# Patient Record
Sex: Male | Born: 1940 | Race: White | Hispanic: No | Marital: Married | Smoking: Current every day smoker
Health system: Southern US, Community
[De-identification: ages and names within clinical notes are randomized; demographics above are authoritative.]

## PROBLEM LIST (undated history)

## (undated) DIAGNOSIS — C801 Malignant (primary) neoplasm, unspecified: Secondary | ICD-10-CM

## (undated) DIAGNOSIS — E7800 Pure hypercholesterolemia, unspecified: Secondary | ICD-10-CM

## (undated) DIAGNOSIS — I10 Essential (primary) hypertension: Secondary | ICD-10-CM

## (undated) DIAGNOSIS — I82409 Acute embolism and thrombosis of unspecified deep veins of unspecified lower extremity: Secondary | ICD-10-CM

## (undated) HISTORY — PX: VARICOSE VEIN SURGERY: SHX832

---

## 2012-07-30 DEATH — deceased

## 2021-07-09 ENCOUNTER — Emergency Department (HOSPITAL_COMMUNITY): Payer: BC Managed Care – PPO

## 2021-07-09 ENCOUNTER — Encounter (HOSPITAL_COMMUNITY): Payer: Self-pay

## 2021-07-09 ENCOUNTER — Emergency Department (HOSPITAL_COMMUNITY)
Admission: EM | Admit: 2021-07-09 | Discharge: 2021-07-09 | Disposition: A | Discharge status: Home / Self Care | Payer: BC Managed Care – PPO | Attending: Emergency Medicine | Admitting: Emergency Medicine

## 2021-07-09 ENCOUNTER — Other Ambulatory Visit: Payer: Self-pay

## 2021-07-09 DIAGNOSIS — R4182 Altered mental status, unspecified: Secondary | ICD-10-CM | POA: Diagnosis present

## 2021-07-09 DIAGNOSIS — F17210 Nicotine dependence, cigarettes, uncomplicated: Secondary | ICD-10-CM | POA: Insufficient documentation

## 2021-07-09 DIAGNOSIS — I10 Essential (primary) hypertension: Secondary | ICD-10-CM | POA: Insufficient documentation

## 2021-07-09 DIAGNOSIS — Z8551 Personal history of malignant neoplasm of bladder: Secondary | ICD-10-CM | POA: Insufficient documentation

## 2021-07-09 DIAGNOSIS — G459 Transient cerebral ischemic attack, unspecified: Secondary | ICD-10-CM | POA: Diagnosis not present

## 2021-07-09 HISTORY — DX: Malignant (primary) neoplasm, unspecified: C80.1

## 2021-07-09 HISTORY — DX: Pure hypercholesterolemia, unspecified: E78.00

## 2021-07-09 HISTORY — DX: Essential (primary) hypertension: I10

## 2021-07-09 HISTORY — DX: Acute embolism and thrombosis of unspecified deep veins of unspecified lower extremity: I82.409

## 2021-07-09 LAB — CBC WITH DIFFERENTIAL/PLATELET
Abs Immature Granulocytes: 0.03 10*3/uL (ref 0.00–0.07)
Basophils Absolute: 0.1 10*3/uL (ref 0.0–0.1)
Basophils Relative: 1 %
Eosinophils Absolute: 0 10*3/uL (ref 0.0–0.5)
Eosinophils Relative: 0 %
HCT: 52.8 % — ABNORMAL HIGH (ref 39.0–52.0)
Hemoglobin: 17.5 g/dL — ABNORMAL HIGH (ref 13.0–17.0)
Immature Granulocytes: 0 %
Lymphocytes Relative: 13 %
Lymphs Abs: 1.1 10*3/uL (ref 0.7–4.0)
MCH: 33.7 pg (ref 26.0–34.0)
MCHC: 33.1 g/dL (ref 30.0–36.0)
MCV: 101.5 fL — ABNORMAL HIGH (ref 80.0–100.0)
Monocytes Absolute: 0.5 10*3/uL (ref 0.1–1.0)
Monocytes Relative: 5 %
Neutro Abs: 7.1 10*3/uL (ref 1.7–7.7)
Neutrophils Relative %: 81 %
Platelets: 168 10*3/uL (ref 150–400)
RBC: 5.2 MIL/uL (ref 4.22–5.81)
RDW: 13 % (ref 11.5–15.5)
WBC: 8.8 10*3/uL (ref 4.0–10.5)
nRBC: 0 % (ref 0.0–0.2)

## 2021-07-09 LAB — URINALYSIS, ROUTINE W REFLEX MICROSCOPIC
Bacteria, UA: NONE SEEN
Bilirubin Urine: NEGATIVE
Glucose, UA: NEGATIVE mg/dL
Ketones, ur: 5 mg/dL — AB
Leukocytes,Ua: NEGATIVE
Nitrite: NEGATIVE
Protein, ur: NEGATIVE mg/dL
Specific Gravity, Urine: 1.017 (ref 1.005–1.030)
pH: 6 (ref 5.0–8.0)

## 2021-07-09 LAB — COMPREHENSIVE METABOLIC PANEL
ALT: 20 U/L (ref 0–44)
AST: 22 U/L (ref 15–41)
Albumin: 4.1 g/dL (ref 3.5–5.0)
Alkaline Phosphatase: 59 U/L (ref 38–126)
Anion gap: 8 (ref 5–15)
BUN: 20 mg/dL (ref 8–23)
CO2: 27 mmol/L (ref 22–32)
Calcium: 9 mg/dL (ref 8.9–10.3)
Chloride: 103 mmol/L (ref 98–111)
Creatinine, Ser: 1.18 mg/dL (ref 0.61–1.24)
GFR, Estimated: >60 mL/min (ref >60)
Glucose, Bld: 108 mg/dL — ABNORMAL HIGH (ref 70–99)
Potassium: 3.8 mmol/L (ref 3.5–5.1)
Sodium: 138 mmol/L (ref 135–145)
Total Bilirubin: 1.1 mg/dL (ref 0.3–1.2)
Total Protein: 7.4 g/dL (ref 6.5–8.1)

## 2021-07-09 LAB — TSH: TSH: 1.058 u[IU]/mL (ref 0.350–4.500)

## 2021-07-09 LAB — ETHANOL: Alcohol, Ethyl (B): <10 mg/dL (ref <10)

## 2021-07-09 LAB — LIPASE, BLOOD: Lipase: 31 U/L (ref 11–51)

## 2021-07-09 NOTE — ED Triage Notes (Signed)
Pt presents to ED and states he woke up at 0200, states he was confused, not answering his wife's questions appropriately. States he is now answering questions appropriately and not confused. Pt states he went to bed at 2200 last night and felt fine.

## 2021-07-09 NOTE — Discharge Instructions (Addendum)
You were seen in the emerge department today with stroke symptoms.  Your MRI did not show a stroke but we discussed the benefit of keeping you in the hospital for some additional testing prior to sending you home.  You are electing to leave the hospital now but please know that you are welcome to return at any time should you change your mind about additional treatment.  I have placed a referral in our system for you to see a neurologist but realize that this will take some time.  If you develop any new or suddenly worsening symptoms please call 911.

## 2021-07-09 NOTE — ED Provider Notes (Signed)
Emergency Department Provider Note   I have reviewed the triage vital signs and the nursing notes.   HISTORY  Chief Complaint Altered Mental Status   HPI Lucas Taylor is a 80 y.o. male with past medical history reviewed below presents to the emergency department with mental status change overnight.  Patient went to bed and was last normal at 10 PM yesterday.  He woke up at around 2 AM to go to the bathroom.  Patient's wife states that she spoke to him but that he was not able to formulate a response.  He would say 1-2 word responses but seem to be having significant word finding difficulty.  Patient tells me that he could understand what his wife was saying but had difficulty formulating a response.  He ultimately laid back down and woke up again at 4 AM.  He went to make coffee but got confused and tried to pour the coffee into the half-and-half container.  His wife and patient's state that he is seeming much better at this point but they are concerned for the possibility of stroke.  He has no prior history of stroke.  He is on anticoagulation.  He is not having chest pain.  He has had some pressure behind the eyes several times over the past few days but no active headache.    Past Medical History:  Diagnosis Date   Cancer [facility])    Bladder   DVT (deep venous thrombosis) (HCC)    Hypercholesterolemia    Hypertension     There are no problems to display for this patient.   Past Surgical History:  Procedure Laterality Date   VARICOSE VEIN SURGERY      Allergies Patient has no known allergies.  No family history on file.  Social History Social History   Tobacco Use   Smoking status: Every Day    Packs/day: 0.50    Pack years: 0.00    Types: Cigarettes   Smokeless tobacco: Never  Substance Use Topics   Alcohol use: Yes    Comment: 1 glass of tonic a week   Drug use: Never    Review of Systems  Constitutional: No fever/chills Eyes: No visual changes. ENT: No  sore throat. Cardiovascular: Denies chest pain. Respiratory: Denies shortness of breath. Gastrointestinal: No abdominal pain.  No nausea, no vomiting.  No diarrhea.  No constipation. Genitourinary: Negative for dysuria. Musculoskeletal: Negative for back pain. Skin: Negative for rash. Neurological: Negative for headaches, focal weakness or numbness. Positive speech difficulty.   10-point ROS otherwise negative.  ____________________________________________   PHYSICAL EXAM:  VITAL SIGNS: ED Triage Vitals  Enc Vitals Group     BP 07/09/21 0850 (!) 167/106     Pulse Rate 07/09/21 0850 93     Resp 07/09/21 0850 18     Temp 07/09/21 0850 98.1 F (36.7 C)     Temp Source 07/09/21 0850 Oral     SpO2 07/09/21 0850 99 %     Weight 07/09/21 0843 195 lb (88.5 kg)     Height 07/09/21 0843 5\' 10"  (1.778 m)   Constitutional: Alert and oriented. Well appearing and in no acute distress. Eyes: Conjunctivae are normal. PERRL. EOMI. Head: Atraumatic. Nose: No congestion/rhinnorhea. Mouth/Throat: Mucous membranes are moist.   Neck: No stridor.  Cardiovascular: Normal rate, regular rhythm. Good peripheral circulation. Grossly normal heart sounds.   Respiratory: Normal respiratory effort.  No retractions. Lungs CTAB. Gastrointestinal: Soft and nontender. No distention.  Musculoskeletal: No lower extremity  tenderness nor edema. No gross deformities of extremities. Neurologic:  Normal speech and language. No gross focal neurologic deficits are appreciated. 5/5 strength in the bilateral upper/lower extremities. Normal sensation.  Skin:  Skin is warm, dry and intact. No rash noted.   ____________________________________________   LABS (all labs ordered are listed, but only abnormal results are displayed)  Labs Reviewed  COMPREHENSIVE METABOLIC PANEL - Abnormal; Notable for the following components:      Result Value   Glucose, Bld 108 (*)    All other components within normal limits  CBC  WITH DIFFERENTIAL/PLATELET - Abnormal; Notable for the following components:   Hemoglobin 17.5 (*)    HCT 52.8 (*)    MCV 101.5 (*)    All other components within normal limits  URINALYSIS, ROUTINE W REFLEX MICROSCOPIC - Abnormal; Notable for the following components:   Hgb urine dipstick MODERATE (*)    Ketones, ur 5 (*)    All other components within normal limits  ETHANOL  LIPASE, BLOOD  TSH   ____________________________________________  EKG   EKG Interpretation  Date/Time:  Monday July 09 2021 09:10:29 EDT Ventricular Rate:  99 PR Interval:    QRS Duration: 91 QT Interval:  366 QTC Calculation: 473 R Axis:   -1 Text Interpretation: Normal sinus rhythm Borderline repolarization abnormality Baseline wander in lead(s) II III aVF Confirmed by Nanda Quinton [phone]) on 07/09/2021 9:25:49 AM         ____________________________________________  RADIOLOGY  CT Head Wo Contrast  Result Date: 07/09/2021 CLINICAL DATA:  Mental status changes. EXAM: CT HEAD WITHOUT CONTRAST TECHNIQUE: Contiguous axial images were obtained from the base of the skull through the vertex without intravenous contrast. COMPARISON:  None. FINDINGS: Brain: There is no evidence for acute hemorrhage, hydrocephalus, mass lesion, or abnormal extra-axial fluid collection. No definite CT evidence for acute infarction. Diffuse loss of parenchymal volume is consistent with atrophy. Patchy low attenuation in the deep hemispheric and periventricular white matter is nonspecific, but likely reflects chronic microvascular ischemic demyelination. Vascular: No hyperdense vessel or unexpected calcification. Skull: No evidence for fracture. No worrisome lytic or sclerotic lesion. Sinuses/Orbits: The visualized paranasal sinuses and mastoid air cells are clear. Visualized portions of the globes and intraorbital fat are unremarkable. Other: None. IMPRESSION: 1. No acute intracranial abnormality. 2. Atrophy with chronic small  vessel white matter ischemic disease. Electronically Signed   By: Misty Stanley M.D.   On: 07/09/2021 09:46   MR BRAIN WO CONTRAST  Result Date: 07/09/2021 CLINICAL DATA:  Mental status change, unknown cause. Additional history provided: Transient confusion. EXAM: MRI HEAD WITHOUT CONTRAST TECHNIQUE: Multiplanar, multiecho pulse sequences of the brain and surrounding structures were obtained without intravenous contrast. COMPARISON:  Head CT 06/09/2021. FINDINGS: Brain: Mild-to-moderate generalized cerebral atrophy. 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 Comparatively mild cerebellar atrophy. Moderate multifocal T2/FLAIR hyperintensity within the cerebral white matter, nonspecific but compatible with chronic small vessel ischemic disease. There is no acute infarct. No evidence of an intracranial mass. No chronic intracranial blood products. No extra-axial fluid collection. No midline shift. Partially empty sella turcica. Vascular: Expected proximal arterial flow voids. Skull and upper cervical spine: No focal marrow lesion. Sinuses/Orbits: Visualized orbits show no acute finding. Bilateral lens replacements. Trace bilateral ethmoid sinus mucosal thickening. Other: Right mastoid effusion. IMPRESSION: No evidence of acute intracranial abnormality. 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 Moderate chronic small  vessel ischemic changes within the cerebral white matter. Mild-to-moderate generalized cerebral atrophy. Comparatively mild cerebellar atrophy. Right mastoid effusion. Electronically Signed   By: Kellie Simmering DO   On: 07/09/2021 13:50    ____________________________________________   PROCEDURES  Procedure(s) performed:   Procedures  None  ____________________________________________   INITIAL IMPRESSION / ASSESSMENT AND PLAN / ED COURSE  Pertinent labs &  imaging results that were available during my care of the patient were reviewed by me and considered in my medical decision making (see chart for details).   Patient presents to the emergency department with difficulty with speech at 2 AM.  Last normal at 10 PM.  He is not having any active deficits.  Differential includes TIA but the symptoms did happen at 2 AM when he awoke.  He is not having any active symptoms.  Recent changes to blood pressure medication noted and his pressures are running high here.  We will start with CT imaging of the head along with labs.  Patient may ultimately require MRI.   02:30 PM  Lab work, CT, MRI reviewed.  He has no evidence of acute infarct.  Discussed the case with Dr. Malen Gauze and given symptoms last night he does recommend admit for CTA head neck along with echo and other TIA work-up.  Discussed this with the patient.  His wife is at bedside during discussion.  Patient states that he does not want to come into the hospital.  We discussed the risk of this decision including increased risk for additional stroke within the next week especially.  We discussed that getting these tests done in the hospital and started on appropriate medication is essential.  Patient tells me he cannot come to the hospital and that he understands the risk.  His wife is at bedside and tried to convince him but is unable to do so.  Patient tells me he plans to return when he is ready.  ____________________________________________  FINAL CLINICAL IMPRESSION(S) / ED DIAGNOSES  Final diagnoses:  TIA (transient ischemic attack)     Note:  This document was prepared using Dragon voice recognition software and may include unintentional dictation errors.  Nanda Quinton, MD, [facility] Emergency Medicine    Elaiza Shoberg, [facility], MD 07/09/21 1438

## 2021-07-10 ENCOUNTER — Observation Stay (HOSPITAL_COMMUNITY)
Admission: EM | Admit: 2021-07-10 | Discharge: 2021-07-11 | Disposition: A | Discharge status: Home / Self Care | Payer: BC Managed Care – PPO | Attending: Family Medicine | Admitting: Family Medicine

## 2021-07-10 ENCOUNTER — Observation Stay (HOSPITAL_COMMUNITY): Payer: BC Managed Care – PPO

## 2021-07-10 ENCOUNTER — Other Ambulatory Visit: Payer: Self-pay

## 2021-07-10 ENCOUNTER — Encounter (HOSPITAL_COMMUNITY): Payer: Self-pay | Admitting: Emergency Medicine

## 2021-07-10 DIAGNOSIS — I480 Paroxysmal atrial fibrillation: Secondary | ICD-10-CM | POA: Diagnosis not present

## 2021-07-10 DIAGNOSIS — Z79899 Other long term (current) drug therapy: Secondary | ICD-10-CM | POA: Insufficient documentation

## 2021-07-10 DIAGNOSIS — R410 Disorientation, unspecified: Secondary | ICD-10-CM | POA: Diagnosis present

## 2021-07-10 DIAGNOSIS — K469 Unspecified abdominal hernia without obstruction or gangrene: Secondary | ICD-10-CM | POA: Diagnosis present

## 2021-07-10 DIAGNOSIS — F17210 Nicotine dependence, cigarettes, uncomplicated: Secondary | ICD-10-CM | POA: Diagnosis not present

## 2021-07-10 DIAGNOSIS — I82409 Acute embolism and thrombosis of unspecified deep veins of unspecified lower extremity: Secondary | ICD-10-CM | POA: Diagnosis present

## 2021-07-10 DIAGNOSIS — I10 Essential (primary) hypertension: Secondary | ICD-10-CM | POA: Diagnosis not present

## 2021-07-10 DIAGNOSIS — Z126 Encounter for screening for malignant neoplasm of bladder: Secondary | ICD-10-CM | POA: Diagnosis not present

## 2021-07-10 DIAGNOSIS — Z7901 Long term (current) use of anticoagulants: Secondary | ICD-10-CM | POA: Insufficient documentation

## 2021-07-10 DIAGNOSIS — G459 Transient cerebral ischemic attack, unspecified: Principal | ICD-10-CM | POA: Diagnosis present

## 2021-07-10 DIAGNOSIS — Z20822 Contact with and (suspected) exposure to covid-19: Secondary | ICD-10-CM | POA: Insufficient documentation

## 2021-07-10 DIAGNOSIS — Z720 Tobacco use: Secondary | ICD-10-CM | POA: Diagnosis present

## 2021-07-10 LAB — CBC WITH DIFFERENTIAL/PLATELET
Abs Immature Granulocytes: 0.02 10*3/uL (ref 0.00–0.07)
Basophils Absolute: 0.1 10*3/uL (ref 0.0–0.1)
Basophils Relative: 1 %
Eosinophils Absolute: 0.1 10*3/uL (ref 0.0–0.5)
Eosinophils Relative: 1 %
HCT: 53 % — ABNORMAL HIGH (ref 39.0–52.0)
Hemoglobin: 17.4 g/dL — ABNORMAL HIGH (ref 13.0–17.0)
Immature Granulocytes: 0 %
Lymphocytes Relative: 22 %
Lymphs Abs: 1.4 10*3/uL (ref 0.7–4.0)
MCH: 33.5 pg (ref 26.0–34.0)
MCHC: 32.8 g/dL (ref 30.0–36.0)
MCV: 101.9 fL — ABNORMAL HIGH (ref 80.0–100.0)
Monocytes Absolute: 0.5 10*3/uL (ref 0.1–1.0)
Monocytes Relative: 8 %
Neutro Abs: 4.3 10*3/uL (ref 1.7–7.7)
Neutrophils Relative %: 68 %
Platelets: 153 10*3/uL (ref 150–400)
RBC: 5.2 MIL/uL (ref 4.22–5.81)
RDW: 13.2 % (ref 11.5–15.5)
WBC: 6.3 10*3/uL (ref 4.0–10.5)
nRBC: 0 % (ref 0.0–0.2)

## 2021-07-10 LAB — BASIC METABOLIC PANEL
Anion gap: 8 (ref 5–15)
BUN: 22 mg/dL (ref 8–23)
CO2: 28 mmol/L (ref 22–32)
Calcium: 9 mg/dL (ref 8.9–10.3)
Chloride: 102 mmol/L (ref 98–111)
Creatinine, Ser: 1.24 mg/dL (ref 0.61–1.24)
GFR, Estimated: 60 mL/min — ABNORMAL LOW (ref >60)
Glucose, Bld: 93 mg/dL (ref 70–99)
Potassium: 3.8 mmol/L (ref 3.5–5.1)
Sodium: 138 mmol/L (ref 135–145)

## 2021-07-10 LAB — RESP PANEL BY RT-PCR (FLU A&B, COVID) ARPGX2
Influenza A by PCR: NEGATIVE
Influenza B by PCR: NEGATIVE
SARS Coronavirus 2 by RT PCR: NEGATIVE

## 2021-07-10 MED ORDER — ACETAMINOPHEN 325 MG PO TABS: 650.0000 mg | ORAL_TABLET | Freq: Four times a day (QID) | ORAL | Status: DC | PRN

## 2021-07-10 MED ORDER — ATORVASTATIN CALCIUM 40 MG PO TABS
40.0000 mg | ORAL_TABLET | Freq: Every day | ORAL | Status: DC
  Administered 2021-07-11: 40 mg via ORAL
  Filled 2021-07-10: qty 1

## 2021-07-10 MED ORDER — ACETAMINOPHEN 650 MG RE SUPP: 650.0000 mg | Freq: Four times a day (QID) | RECTAL | Status: DC | PRN

## 2021-07-10 MED ORDER — ONDANSETRON HCL 4 MG PO TABS: 4.0000 mg | ORAL_TABLET | Freq: Four times a day (QID) | ORAL | Status: DC | PRN

## 2021-07-10 MED ORDER — LOSARTAN POTASSIUM 50 MG PO TABS
50.0000 mg | ORAL_TABLET | Freq: Every day | ORAL | Status: DC
  Administered 2021-07-11: 50 mg via ORAL
  Filled 2021-07-10: qty 1

## 2021-07-10 MED ORDER — NICOTINE 21 MG/24HR TD PT24
21.0000 mg | MEDICATED_PATCH | Freq: Once | TRANSDERMAL | Status: AC
  Administered 2021-07-10: 21 mg via TRANSDERMAL
  Filled 2021-07-10: qty 1

## 2021-07-10 MED ORDER — ONDANSETRON HCL 4 MG/2ML IJ SOLN: 4.0000 mg | Freq: Four times a day (QID) | INTRAMUSCULAR | Status: DC | PRN

## 2021-07-10 MED ORDER — TRAZODONE HCL 50 MG PO TABS: 50.0000 mg | ORAL_TABLET | Freq: Every evening | ORAL | Status: DC | PRN

## 2021-07-10 MED ORDER — RIVAROXABAN 20 MG PO TABS
20.0000 mg | ORAL_TABLET | Freq: Every day | ORAL | Status: DC
  Administered 2021-07-11: 20 mg via ORAL
  Filled 2021-07-10: qty 1

## 2021-07-10 MED ORDER — METOPROLOL SUCCINATE ER 50 MG PO TB24
50.0000 mg | ORAL_TABLET | Freq: Every day | ORAL | Status: DC
  Administered 2021-07-11: 50 mg via ORAL
  Filled 2021-07-10: qty 1

## 2021-07-10 MED ORDER — POLYETHYLENE GLYCOL 3350 17 G PO PACK: 17.0000 g | PACK | Freq: Every day | ORAL | Status: DC | PRN

## 2021-07-10 MED ORDER — IOHEXOL 350 MG/ML SOLN
75.0000 mL | Freq: Once | INTRAVENOUS | Status: AC | PRN
  Administered 2021-07-10: 75 mL via INTRAVENOUS

## 2021-07-10 NOTE — ED Notes (Signed)
Pt has removed his leads, blood pressure cuff and put his clothes back on, even though he knows he being admitted.  Wife at bedside.

## 2021-07-10 NOTE — ED Provider Notes (Signed)
[facility] EMERGENCY DEPARTMENT Provider Note   CSN: [id number] Arrival date & time: 07/10/21  1051     History Chief Complaint  Patient presents with   Altered Mental Status    Lucas Taylor is a 80 y.o. male with a history including hypertension, hypercholesterolemia, history of DVT and bladder cancer presenting for reevaluation after being seen here yesterday and left AGAINST MEDICAL ADVICE due to symptoms strongly suggestive of TIA events.  He woke yesterday around 2 AM to go the bathroom at which time his wife noted that he was unable to speak to her.  He endorses that he was able to understand her but physically could not respond to her and has had significant word finding difficulties which have been intermittent until the time he presented here yesterday.  Wife also reports that he got very confused trying to make coffee yesterday morning which is very atypical for him.  He has no prior history of CVA.  He denies chest pain, headache.  He is anticoagulated with Xarelto.  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  During yesterday's visit he was strongly encouraged to stay for admission for a TIA work-up but at that time he refused.  He returns today for admission for this work-up.  He has had no further symptoms since he left here yesterday.  Of note, neurology consult was obtained yesterday via Dr. Malen Gauze who did recommend admission for CTA head and neck along with echocardiogram and other TIA work-up.  The history is provided by the patient.      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   Past Surgical History:  Procedure Laterality Date   VARICOSE VEIN SURGERY          History reviewed. No pertinent family history.  Social History   Tobacco Use   Smoking status: Every Day    Packs/day: 0.50    Pack years: 0.00    Types: Cigarettes   Smokeless tobacco: Never  Substance Use Topics   Alcohol use: Yes    Comment: 1 glass of tonic a week   Drug use: Never    Home Medications Prior to Admission medications   Medication Sig Start Date End Date Taking? Authorizing Provider  acetaminophen (TYLENOL) 500 MG tablet Take 1,000 mg by mouth every 6 (six) hours as needed for mild pain.    [provider]  losartan (COZAAR) 50 MG tablet Take 50 mg by mouth daily. 07/06/21   [provider]  metoprolol succinate (TOPROL-XL) 50 MG 24 hr tablet Take 50 mg by mouth daily. 07/06/21   [provider]  simvastatin (ZOCOR) 10 MG tablet Take 10 mg by mouth daily as needed. 06/27/21   [provider]  XARELTO 20 MG TABS tablet Take 20 mg by mouth daily. 06/22/21   [provider]    Allergies    Patient has no known allergies.  Review of Systems   Review of Systems  Constitutional:  Negative for chills and fever.  HENT:  Negative for congestion and sore throat.   Eyes: Negative.  Negative for visual disturbance.  Respiratory:  Negative for chest tightness and shortness of breath.  Cardiovascular:  Negative for chest pain.  Gastrointestinal:  Negative for abdominal pain, nausea and vomiting.  Genitourinary: Negative.   Musculoskeletal:  Negative for arthralgias, joint swelling and neck pain.  Skin: Negative.  Negative for rash and wound.  Neurological:  Positive for speech difficulty. Negative for dizziness, weakness, light-headedness, numbness and headaches.  Psychiatric/Behavioral:  Positive for confusion.   All other systems reviewed and are negative.  Physical Exam Updated Vital Signs BP (!) 142/109   Pulse 90   Temp 98.5 F (36.9 C) (Oral)   Resp (!) 22   Ht _0  (1.778 m)   Wt 88.5 kg   SpO2 91%    BMI 27.98 kg/m   Physical Exam Vitals and nursing note reviewed.  Constitutional:      Appearance: He is well-developed.  HENT:     Head: Normocephalic and atraumatic.  Eyes:     Conjunctiva/sclera: Conjunctivae normal.  Cardiovascular:     Rate and Rhythm: Normal rate and regular rhythm.     Heart sounds: Normal heart sounds.  Pulmonary:     Effort: Pulmonary effort is normal.     Breath sounds: Normal breath sounds. No wheezing.  Abdominal:     General: Bowel sounds are normal.     Palpations: Abdomen is soft.     Tenderness: There is no abdominal tenderness.  Musculoskeletal:        General: Normal range of motion.     Cervical back: Normal range of motion.  Skin:    General: Skin is warm and dry.  Neurological:     Mental Status: He is alert.     Cranial Nerves: Cranial nerves are intact. No dysarthria.     Motor: Motor function is intact.     Coordination: Coordination normal.    ED Results / Procedures / Treatments   Labs (all labs ordered are listed, but only abnormal results are displayed) Labs Reviewed  CBC WITH DIFFERENTIAL/PLATELET - Abnormal; Notable for the following components:      Result Value   Hemoglobin 17.4 (*)    HCT 53.0 (*)    MCV 101.9 (*)    All other components within normal limits  BASIC METABOLIC PANEL - Abnormal; Notable for the following components:   GFR, Estimated 60 (*)    All other components within normal limits  RESP PANEL BY RT-PCR (FLU A&B, COVID) [id number]    EKG None  Radiology CT Head Wo Contrast  Result Date: 07/09/2021 CLINICAL DATA:  Mental status changes. EXAM: CT HEAD WITHOUT CONTRAST TECHNIQUE: Contiguous axial images were obtained from the base of the skull through the vertex without intravenous contrast. COMPARISON:  None. FINDINGS: Brain: There is no evidence for acute hemorrhage, hydrocephalus, mass lesion, or abnormal extra-axial fluid collection. No definite CT evidence for acute infarction. Diffuse loss of  parenchymal volume is consistent with atrophy. Patchy low attenuation in the deep hemispheric and periventricular white matter is nonspecific, but likely reflects chronic microvascular ischemic demyelination. Vascular: No hyperdense vessel or unexpected calcification. Skull: No evidence for fracture. No worrisome lytic or sclerotic lesion. Sinuses/Orbits: The visualized paranasal sinuses and mastoid air cells are clear. Visualized portions of the globes and intraorbital fat are unremarkable. Other: None. IMPRESSION: 1. No acute intracranial abnormality. 2. Atrophy with chronic small vessel white matter ischemic disease. Electronically Signed   By: Misty Stanley M.D.   On: 07/09/2021 09:46   MR BRAIN WO CONTRAST  Result Date: 07/09/2021 CLINICAL DATA:  Mental status change, unknown cause. Additional  history provided: Transient confusion. EXAM: MRI HEAD WITHOUT CONTRAST TECHNIQUE: Multiplanar, multiecho pulse sequences of the brain and surrounding structures were obtained without intravenous contrast. COMPARISON:  Head CT 06/09/2021. FINDINGS: Brain: Mild-to-moderate generalized cerebral atrophy. 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 Comparatively mild cerebellar atrophy. Moderate multifocal T2/FLAIR hyperintensity within the cerebral white matter, nonspecific but compatible with chronic small vessel ischemic disease. There is no acute infarct. No evidence of an intracranial mass. No chronic intracranial blood products. No extra-axial fluid collection. No midline shift. Partially empty sella turcica. Vascular: Expected proximal arterial flow voids. Skull and upper cervical spine: No focal marrow lesion. Sinuses/Orbits: Visualized orbits show no acute finding. Bilateral lens replacements. Trace bilateral ethmoid sinus mucosal thickening. Other: Right mastoid effusion. IMPRESSION: No evidence of acute intracranial abnormality. A small  chronic cortical infarct is questioned within the left parietal lobe (postcentral gyrus). Moderate chronic small vessel ischemic changes within the cerebral white matter. Mild-to-moderate generalized cerebral atrophy. Comparatively mild cerebellar atrophy. Right mastoid effusion. Electronically Signed   By: Kellie Simmering DO   On: 07/09/2021 13:50    Procedures Procedures   Medications Ordered in ED Medications  nicotine (NICODERM CQ - dosed in mg/24 hours) patch 21 mg (21 mg Transdermal Patch Applied 07/10/21 1450)    ED Course  I have reviewed the triage vital signs and the nursing notes.  Pertinent labs & imaging results that were available during my care of the patient were reviewed by me and considered in my medical decision making (see chart for details).    MDM Rules/Calculators/A&P                          Screening labs were obtained including CBC, be met and a COVID-19 screening in anticipation of admission for TIA work-up.  These labs have resulted and are stable.  Call was placed to the hospitalist for admission.  Patient is somewhat anxious and was concerned about his ability to smoke while here, nicotine patch was ordered.  4:05 PM Discussed with Dr. Denton Brick who accepts pt for admission.     Final Clinical Impression(s) / ED Diagnoses Final diagnoses:  TIA (transient ischemic attack)    Rx / DC Orders ED Discharge Orders     None        Landis Martins 07/10/21 1605    Sherwood Gambler, MD 07/13/21 [phone]

## 2021-07-10 NOTE — H&P (Signed)
History and Physical    Lucas Taylor VZS:[id number] DOB: 1941-09-30 DOA: 07/10/2021  PCP: Moshe Cipro, MD   Patient coming from: Home  I have personally briefly reviewed patient's old medical records in [place]  Chief Complaint: Confusion  HPI: Lucas Taylor is a 80 y.o. male with medical history significant for DVT, HTN, atrial fibrillation.  Patient presented to the ED yesterday morning with reports of confusion and having difficulty getting his words out. Patient awoke at about 2 AM in the morning to go to the bathroom, patient's wife spoke to him, but was having difficulty formulating a response.  He got back to bed and woke up about 4 AM in the morning, he was confused, spouse reports patient's was trying to make coffee, but was pouring coffee into the half-and-half container, still having difficulty getting his words out. No facial asymmetry, no weakness or abnormal sensation involving any extremities, no difficulty swallowing.  No history of prior strokes.  Patient is on anticoagulation with Xarelto for DVT and reports compliance, has not missed any doses.  He was also recently diagnosed with atrial fibrillation, and has been referred to a cardiologist.  On arrival to the ED yesterday, patient was back to his baseline, EDP talked to neurologist Dr. Malen Gauze, who recommended admission for CTA head and neck, Echo for stroke work-up, but patient left AMA.  Patient was referred to neurologist prior to discharge, and patient has an appointment to follow-up with [facility] neurology in [place] in 2 days 7/14.  ED Course: Blood pressure systolic 449E to 010O, heart rate 89-98.  EKG shows atrial fibrillation.  Unremarkable CBC BMP repeated today.  CT head and MRI brain obtained yesterday.  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   Review of Systems: As per HPI all other systems  reviewed and negative.  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 He has been smoking an average of 0.50 packs per day. He has never used smokeless tobacco. He reports current alcohol use. He reports that he does not use drugs.  No Known Allergies  History reviewed. No pertinent family history.  Prior to Admission medications   Medication Sig Start Date End Date Taking? Authorizing Provider  acetaminophen (TYLENOL) 500 MG tablet Take 1,000 mg by mouth every 6 (six) hours as needed for mild pain.    [provider]  losartan (COZAAR) 50 MG tablet Take 50 mg by mouth daily. 07/06/21   [provider]  metoprolol succinate (TOPROL-XL) 50 MG 24 hr tablet Take 50 mg by mouth daily. 07/06/21   [provider]  simvastatin (ZOCOR) 10 MG tablet Take 10 mg by mouth daily as needed. 06/27/21   [provider]  XARELTO 20 MG TABS tablet Take 20 mg by mouth daily. 06/22/21   [provider]    Physical Exam: Vitals:   07/10/21 1119 07/10/21 1200 07/10/21 1336 07/10/21 1530  BP: (!) 155/107 (!) 152/87 (!) 142/109 (!) 142/101  Pulse: 98 93 90 89  Resp: 18 (!) 24 (!) 22 19  Temp: 98.5 F (36.9 C)   98 F (36.7 C)  TempSrc: Oral  Oral  SpO2: 97% 99% 91% 98%  Weight:      Height:        Constitutional: NAD, calm, comfortable Vitals:   07/10/21 1119 07/10/21 1200 07/10/21 1336 07/10/21 1530  BP: (!) 155/107 (!) 152/87 (!) 142/109 (!) 142/101  Pulse: 98 93 90 89  Resp: 18 (!) 24 (!) 22 19  Temp: 98.5 F (36.9 C)   98 F (36.7 C)  TempSrc: Oral   Oral  SpO2: 97% 99% 91% 98%  Weight:      Height:       Eyes: PERRL, lids and conjunctivae normal ENMT: Mucous membranes are moist.  Neck: normal, supple, no masses, no  thyromegaly Respiratory: clear to auscultation bilaterally, no wheezing, no crackles. Normal respiratory effort. No accessory muscle use.  Cardiovascular: Irregular rate and rhythm, no murmurs / rubs / gallops. No extremity edema. 2+ pedal pulses.  Abdomen: large inguinal hernia-present for several years, patient says he declined surgrical correction, soft, no tenderness, no masses palpated. No hepatosplenomegaly. Bowel sounds positive.  Musculoskeletal: no clubbing / cyanosis. No joint deformity upper and lower extremities. Good ROM, no contractures. Normal muscle tone.  Skin: no rashes, lesions, ulcers. No induration Neurologic:  Neurological:     Mental Status: he is alert.     GCS: GCS eye subscore is 4. GCS verbal subscore is 5. GCS motor subscore is 6.     Comments: Mental Status:  Alert, oriented, thought content appropriate, able to give a coherent history. Speech fluent without evidence of aphasia.  Cranial Nerves:  II:  Peripheral visual fields grossly normal, pupils equal, round, reactive to light III,IV, VI: ptosis not present, extra-ocular motions intact bilaterally  V,VII: smile symmetric, eyebrows raise symmetric, facial light touch sensation equal VIII: hearing grossly normal to voice  X:   XI: bilateral shoulder shrug symmetric and strong XII: midline tongue extension without fassiculations Motor:  Normal tone.  5/5 strength in all extremities.  Sensory: Sensation intact to light touch in all extremities.  Cerebellar: normal finger-to-nose with bilateral upper extremities.  CV: distal pulses palpable throughout    Psychiatric: Normal judgment and insight. Alert and oriented x 3. Normal mood.   Labs on Admission: I have personally reviewed following labs and imaging studies  CBC: Recent Labs  Lab 07/09/21 0950 07/10/21 1337  WBC 8.8 6.3  NEUTROABS 7.1 4.3  HGB 17.5* 17.4*  HCT 52.8* 53.0*  MCV 101.5* 101.9*  PLT 168 630   Basic Metabolic Panel: Recent Labs   Lab 07/09/21 0950 07/10/21 1337  NA 138 138  K 3.8 3.8  CL 103 102  CO2 27 28  GLUCOSE 108* 93  BUN 20 22  CREATININE 1.18 1.24  CALCIUM 9.0 9.0    Liver Function Tests: Recent Labs  Lab 07/09/21 0950  AST 22  ALT 20  ALKPHOS 59  BILITOT 1.1  PROT 7.4  ALBUMIN 4.1   Recent Labs  Lab 07/09/21 0950  LIPASE 31   Thyroid Function Tests: Recent Labs    07/09/21 0953  TSH 1.058   Urine analysis:    Component Value Date/Time   COLORURINE YELLOW 07/09/2021 1133   APPEARANCEUR CLEAR 07/09/2021 1133   LABSPEC 1.017 07/09/2021 1133   PHURINE 6.0 07/09/2021 1133   GLUCOSEU NEGATIVE 07/09/2021 1133   HGBUR MODERATE (A) 07/09/2021 1133   BILIRUBINUR NEGATIVE 07/09/2021 1133   KETONESUR 5 (A) 07/09/2021 1133   PROTEINUR NEGATIVE 07/09/2021 1133   NITRITE NEGATIVE 07/09/2021 1133   LEUKOCYTESUR NEGATIVE 07/09/2021  [place] on Admission: CT Head Wo Contrast  Result Date: 07/09/2021 CLINICAL DATA:  Mental status changes. EXAM: CT HEAD WITHOUT CONTRAST TECHNIQUE: Contiguous axial images were obtained from the base of the skull through the vertex without intravenous contrast. COMPARISON:  None. FINDINGS: Brain: There is no evidence for acute hemorrhage, hydrocephalus, mass lesion, or abnormal extra-axial fluid collection. No definite CT evidence for acute infarction. Diffuse loss of parenchymal volume is consistent with atrophy. Patchy low attenuation in the deep hemispheric and periventricular white matter is nonspecific, but likely reflects chronic microvascular ischemic demyelination. Vascular: No hyperdense vessel or unexpected calcification. Skull: No evidence for fracture. No worrisome lytic or sclerotic lesion. Sinuses/Orbits: The visualized paranasal sinuses and mastoid air cells are clear. Visualized portions of the globes and intraorbital fat are unremarkable. Other: None. IMPRESSION: 1. No acute intracranial abnormality. 2. Atrophy with chronic small  vessel white matter ischemic disease. Electronically Signed   By: Misty Stanley M.D.   On: 07/09/2021 09:46   MR BRAIN WO CONTRAST  Result Date: 07/09/2021 CLINICAL DATA:  Mental status change, unknown cause. Additional history provided: Transient confusion. EXAM: MRI HEAD WITHOUT CONTRAST TECHNIQUE: Multiplanar, multiecho pulse sequences of the brain and surrounding structures were obtained without intravenous contrast. COMPARISON:  Head CT 06/09/2021. FINDINGS: Brain: Mild-to-moderate generalized cerebral atrophy. 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 Comparatively mild cerebellar atrophy. Moderate multifocal T2/FLAIR hyperintensity within the cerebral white matter, nonspecific but compatible with chronic small vessel ischemic disease. There is no acute infarct. No evidence of an intracranial mass. No chronic intracranial blood products. No extra-axial fluid collection. No midline shift. Partially empty sella turcica. Vascular: Expected proximal arterial flow voids. Skull and upper cervical spine: No focal marrow lesion. Sinuses/Orbits: Visualized orbits show no acute finding. Bilateral lens replacements. Trace bilateral ethmoid sinus mucosal thickening. Other: Right mastoid effusion. IMPRESSION: No evidence of acute intracranial abnormality. 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 Moderate chronic small vessel ischemic changes within the cerebral white matter. Mild-to-moderate generalized cerebral atrophy. Comparatively mild cerebellar atrophy. Right mastoid effusion. Electronically Signed   By: Kellie Simmering DO   On: 07/09/2021 13:50    EKG: Independently reviewed. Atria fibrillation.  Rate 99, QTc 473.  No old EKG to compare.  No significant ST or T wave abnormalities appreciated.  Assessment/Plan Principal Problem:   TIA (transient ischemic attack) Active  Problems:   HTN (hypertension)   Tobacco abuse   AF (paroxysmal atrial fibrillation) ([facility])   DVT (deep venous thrombosis) ([facility])   Abdominal hernia    TIA- symptoms 2 nights ago, has since been back to baseline, no new deficits.  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  He has a history of atrial fibrillation with ongoing tobacco use. -Obtain CTA head and neck -Echocardiogram - Lipid panel, A1c - Speech therapy evaluation -As MRI is without acute deficits, I do not think patient needs to be transferred to [facility]. - Will start atorvastatin 40mg  daily -Continue Xarelto  Atria Fibrillation-rate controlled and on anticoagulation. -Resume metoprolol and Xarelto  DVT-  - Resume Xarelto  HTN- Stable - Resume losartan, metoprolol  Large lower abdominal hernia-patient has been offered surgery for this, but he declined.  Tobacco use -Smokes about a pack of cigarettes daily.   DVT prophylaxis:  Xarelto Code Status: Full code Family Communication: Spouse  at bedside Disposition Plan: ~ 1 day Consults called: None Admission status:  Obs tele    Bethena Roys MD [facility]  07/10/2021, 6:00 PM

## 2021-07-10 NOTE — ED Triage Notes (Signed)
Pt seen yesterday for AMS and dx with TIA. Pt back today c/o still having confusion. Pt states he is coming back today to have more tests completed.

## 2021-07-11 ENCOUNTER — Observation Stay (HOSPITAL_BASED_OUTPATIENT_CLINIC_OR_DEPARTMENT_OTHER): Payer: BC Managed Care – PPO

## 2021-07-11 DIAGNOSIS — Z720 Tobacco use: Secondary | ICD-10-CM

## 2021-07-11 DIAGNOSIS — G459 Transient cerebral ischemic attack, unspecified: Secondary | ICD-10-CM

## 2021-07-11 DIAGNOSIS — I480 Paroxysmal atrial fibrillation: Secondary | ICD-10-CM

## 2021-07-11 LAB — ECHOCARDIOGRAM COMPLETE
AR max vel: 2.59 cm2
AV Area VTI: 2.57 cm2
AV Area mean vel: 2.48 cm2
AV Mean grad: 2 mmHg
AV Peak grad: 4.5 mmHg
Ao pk vel: 1.06 m/s
Area-P 1/2: 2.69 cm2
Height: 70 in
MV VTI: 3.89 cm2
S' Lateral: 2.87 cm
Weight: 3119.99 oz

## 2021-07-11 LAB — LIPID PANEL
Cholesterol: 118 mg/dL (ref 0–200)
HDL: 48 mg/dL (ref >40)
LDL Cholesterol: 62 mg/dL (ref 0–99)
Total CHOL/HDL Ratio: 2.5 RATIO
Triglycerides: 38 mg/dL (ref <150)
VLDL: 8 mg/dL (ref 0–40)

## 2021-07-11 LAB — HEMOGLOBIN A1C
Hgb A1c MFr Bld: 5.2 % (ref 4.8–5.6)
Mean Plasma Glucose: 102.54 mg/dL

## 2021-07-11 MED ORDER — ATORVASTATIN CALCIUM 40 MG PO TABS: 40.0000 mg | ORAL_TABLET | Freq: Every day | ORAL | 1 refills | Status: AC

## 2021-07-11 NOTE — Progress Notes (Signed)
  Echocardiogram 2D Echocardiogram has been performed.  Lucas Taylor 07/11/2021, 9:49 AM

## 2021-07-11 NOTE — Discharge Summary (Signed)
Physician Discharge Summary  Timur Nibert RDE:[id number] DOB: 06/21/1941 DOA: 07/10/2021  PCP: Moshe Cipro, MD  Admit date: 07/10/2021 Discharge date: 07/11/2021  Admitted From: Home Disposition: Home   Recommendations for Outpatient Follow-up:  Follow up with PCP in 1-2 weeks. Note augmentation of statin.  Smoking cessation counseling/assistance. Follow up with neurology tomorrow as scheduled. Strongly suspect an element of cognitive impairment underlying symptoms and may benefit from formal neuropsychiatric evaluation.   Home Health: None Equipment/Devices: None Discharge Condition: Stable CODE STATUS: Full Diet recommendation: Heart healthy  Brief/Interim Summary: Lucas Taylor is a 80 y.o. male with medical history significant for DVT, HTN, atrial fibrillation.  Patient presented to the ED yesterday morning with reports of confusion and having difficulty getting his words out. Patient awoke at about 2 AM in the morning to go to the bathroom, patient's wife spoke to him, but was having difficulty formulating a response.  He got back to bed and woke up about 4 AM in the morning, he was confused, spouse reports patient's was trying to make coffee, but was pouring coffee into the half-and-half container, still having difficulty getting his words out. No facial asymmetry, no weakness or abnormal sensation involving any extremities, no difficulty swallowing.  No history of prior strokes.  Patient is on anticoagulation with Xarelto for DVT and reports compliance, has not missed any doses.  He was also recently diagnosed with atrial fibrillation, and has been referred to a cardiologist.   On arrival to the ED yesterday, patient was back to his baseline, EDP talked to neurologist Dr. Malen Gauze, who recommended admission for CTA head and neck, Echo for stroke work-up, but patient left AMA.  Patient was referred to neurologist prior to discharge, and patient has an appointment to follow-up with  [facility] neurology in [place] in 2 days 7/14.   ED Course: Blood pressure systolic 631S to 970Y, heart rate 89-98.  EKG shows atrial fibrillation.  Unremarkable CBC BMP repeated today.   CT head and MRI brain obtained yesterday.  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   Hospital Course: He was admitted. CT head and neck showed no hemodynamically significant stenoses or LVO. Echocardiogram was negative for cardioembolic source. The patient remains on anticoagulation. Despite favorable lipid panel, statin augmented. Symptoms seem to have resolved and patient is requesting expedited discharge. He will follow up formally with neurology in less than 24 hours as an outpatient.   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 However, the patient's wife admits to the patient recently leaving lights on and water running without remembering to turn them off. 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  - Augmented simvastatin to atorvastatin, though LDL 62, HDL 48. HbA1c 5.2%.  - Continue anticoagulation. Negative echo for CES.  - Smoking cessation was strongly reinforced as the most important issue for his well-being.     Chronic atrial Fibrillation-rate controlled and on anticoagulation. -Resume metoprolol and Xarelto   DVT- - Resume Xarelto   HTN- Stable - Resume losartan, metoprolol  Large lower abdominal hernia-patient has been offered surgery for this, but he declined.   Tobacco  use -Smokes about a pack of cigarettes daily.  Discharge Instructions Discharge Instructions     Diet - low sodium heart healthy   Complete by: As directed    Discharge instructions   Complete by: As directed    You were evaluated for word finding difficulties and do have evidence of an old stroke but no acute stroke to explain the findings. Your symptoms are improved and work up has been completed with no reversible cause for stroke. You will need to continue xarelto as well as change simvastatin to atorvastatin to help reduce risk of another stroke. The greatest thing you can do to reduce your risk of stroke and other life-threatening conditions is to STOP SMOKING.   You have follow up with neurology tomorrow morning, which you should go to at 7:30am. Otherwise, seek medical attention right away if your symptoms return.      Allergies as of 07/11/2021   No Known Allergies      Medication List     STOP taking these medications    simvastatin 10 MG tablet Commonly known as: ZOCOR       TAKE these medications    acetaminophen 500 MG tablet Commonly known as: TYLENOL Take 1,000 mg by mouth every 6 (six) hours as needed for mild pain.   atorvastatin 40 MG tablet Commonly known as: LIPITOR Take 1 tablet (40 mg total) by mouth daily. Start taking on: July 12, 2021   losartan 50 MG tablet Commonly known as: COZAAR Take 50 mg by mouth daily.   metoprolol succinate 50 MG 24 hr tablet Commonly known as: TOPROL-XL Take 50 mg by mouth daily.   Xarelto 20 MG Tabs tablet Generic drug: rivaroxaban Take 20 mg by mouth daily.        Follow-up Information     Moshe Cipro, MD Follow up.   Specialty: Internal Medicine Contact information: [place] [address] [phone]         Marcial Pacas, MD Follow up.   Specialty: Neurology Contact information: [place] [address] [phone]                No  Known Allergies  Consultations: None  Procedures/Studies: CT ANGIO HEAD W OR WO CONTRAST  Result Date: 07/10/2021 CLINICAL DATA:  Word-finding difficulty. EXAM: CT HEAD WITHOUT CONTRAST CT ANGIOGRAPHY OF THE HEAD AND NECK TECHNIQUE: Contiguous axial images were obtained from the base of the skull through the vertex without intravenous contrast. Multidetector CT imaging of the head and neck was performed using the standard protocol during bolus administration of intravenous contrast. Multiplanar CT image reconstructions and MIPs were obtained to evaluate the vascular anatomy. Carotid stenosis measurements (when applicable) are obtained utilizing NASCET criteria, using the distal internal carotid diameter as the denominator. CONTRAST:  32mL OMNIPAQUE IOHEXOL 350 MG/ML SOLN COMPARISON:  07/09/2021. FINDINGS: CT HEAD Brain: No evidence of acute large vascular territory infarction, hemorrhage, hydrocephalus, extra-axial collection or mass lesion/mass effect. Moderate patchy white matter hypoattenuation, is likely related to chronic microvascular disease. Mild-to-moderate generalized atrophy. Possible remote left parietal lobe cortical infarct was better characterized on recent MRI. Vascular: See below. Skull: No acute fracture. Sinuses: Visualized sinuses are clear. Other: No mastoid effusions. CTA NECK Aortic arch: Great vessel origins are patent.  Atherosclerosis. Right carotid system: No significant (greater than 50%) stenosis. Left carotid system: No significant (  greater than 50%) stenosis. Vertebral arteries:Co dominant.No significant (greater than 50%) stenosis. Skeleton: Moderate multilevel degenerative change in the cervical spine. Other neck: No acute abnormality. Emphysema in the visualized lung apices. CTA HEAD Anterior circulation: Bilateral ICAs, MCAs, and ACAs are patent without proximal flow limiting stenosis. No aneurysm identified. The Posterior circulation: Bilateral intradural vertebral  arteries, basilar artery and posterior cerebral arteries are patent without proximal flow limiting stenosis. No aneurysm identified. Venous sinuses: No evidence of dural venous sinus thrombosis. IMPRESSION: 1. No evidence of acute intracranial abnormality. 2. No large vessel occlusion or proximal hemodynamically significant stenosis in the head or neck. 3. Emphysema. Electronically Signed   By: Margaretha Sheffield MD   On: 07/10/2021 17:48   CT Head Wo Contrast  Result Date: 07/09/2021 CLINICAL DATA:  Mental status changes. EXAM: CT HEAD WITHOUT CONTRAST TECHNIQUE: Contiguous axial images were obtained from the base of the skull through the vertex without intravenous contrast. COMPARISON:  None. FINDINGS: Brain: There is no evidence for acute hemorrhage, hydrocephalus, mass lesion, or abnormal extra-axial fluid collection. No definite CT evidence for acute infarction. Diffuse loss of parenchymal volume is consistent with atrophy. Patchy low attenuation in the deep hemispheric and periventricular white matter is nonspecific, but likely reflects chronic microvascular ischemic demyelination. Vascular: No hyperdense vessel or unexpected calcification. Skull: No evidence for fracture. No worrisome lytic or sclerotic lesion. Sinuses/Orbits: The visualized paranasal sinuses and mastoid air cells are clear. Visualized portions of the globes and intraorbital fat are unremarkable. Other: None. IMPRESSION: 1. No acute intracranial abnormality. 2. Atrophy with chronic small vessel white matter ischemic disease. Electronically Signed   By: Misty Stanley M.D.   On: 07/09/2021 09:46   CT ANGIO NECK W OR WO CONTRAST  Result Date: 07/10/2021 CLINICAL DATA:  Word-finding difficulty. EXAM: CT HEAD WITHOUT CONTRAST CT ANGIOGRAPHY OF THE HEAD AND NECK TECHNIQUE: Contiguous axial images were obtained from the base of the skull through the vertex without intravenous contrast. Multidetector CT imaging of the head and neck was  performed using the standard protocol during bolus administration of intravenous contrast. Multiplanar CT image reconstructions and MIPs were obtained to evaluate the vascular anatomy. Carotid stenosis measurements (when applicable) are obtained utilizing NASCET criteria, using the distal internal carotid diameter as the denominator. CONTRAST:  59mL OMNIPAQUE IOHEXOL 350 MG/ML SOLN COMPARISON:  07/09/2021. FINDINGS: CT HEAD Brain: No evidence of acute large vascular territory infarction, hemorrhage, hydrocephalus, extra-axial collection or mass lesion/mass effect. Moderate patchy white matter hypoattenuation, is likely related to chronic microvascular disease. Mild-to-moderate generalized atrophy. Possible remote left parietal lobe cortical infarct was better characterized on recent MRI. Vascular: See below. Skull: No acute fracture. Sinuses: Visualized sinuses are clear. Other: No mastoid effusions. CTA NECK Aortic arch: Great vessel origins are patent.  Atherosclerosis. Right carotid system: No significant (greater than 50%) stenosis. Left carotid system: No significant (greater than 50%) stenosis. Vertebral arteries:Co dominant.No significant (greater than 50%) stenosis. Skeleton: Moderate multilevel degenerative change in the cervical spine. Other neck: No acute abnormality. Emphysema in the visualized lung apices. CTA HEAD Anterior circulation: Bilateral ICAs, MCAs, and ACAs are patent without proximal flow limiting stenosis. No aneurysm identified. The Posterior circulation: Bilateral intradural vertebral arteries, basilar artery and posterior cerebral arteries are patent without proximal flow limiting stenosis. No aneurysm identified. Venous sinuses: No evidence of dural venous sinus thrombosis. IMPRESSION: 1. No evidence of acute intracranial abnormality. 2. No large vessel occlusion or proximal hemodynamically significant stenosis in the head or neck. 3. Emphysema. Electronically Signed  By: Margaretha Sheffield MD   On: 07/10/2021 17:48   MR BRAIN WO CONTRAST  Result Date: 07/09/2021 CLINICAL DATA:  Mental status change, unknown cause. Additional history provided: Transient confusion. EXAM: MRI HEAD WITHOUT CONTRAST TECHNIQUE: Multiplanar, multiecho pulse sequences of the brain and surrounding structures were obtained without intravenous contrast. COMPARISON:  Head CT 06/09/2021. FINDINGS: Brain: Mild-to-moderate generalized cerebral atrophy. 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 Comparatively mild cerebellar atrophy. Moderate multifocal T2/FLAIR hyperintensity within the cerebral white matter, nonspecific but compatible with chronic small vessel ischemic disease. There is no acute infarct. No evidence of an intracranial mass. No chronic intracranial blood products. No extra-axial fluid collection. No midline shift. Partially empty sella turcica. Vascular: Expected proximal arterial flow voids. Skull and upper cervical spine: No focal marrow lesion. Sinuses/Orbits: Visualized orbits show no acute finding. Bilateral lens replacements. Trace bilateral ethmoid sinus mucosal thickening. Other: Right mastoid effusion. IMPRESSION: No evidence of acute intracranial abnormality. 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 Moderate chronic small vessel ischemic changes within the cerebral white matter. Mild-to-moderate generalized cerebral atrophy. Comparatively mild cerebellar atrophy. Right mastoid effusion. Electronically Signed   By: Kellie Simmering DO   On: 07/09/2021 13:50   ECHOCARDIOGRAM COMPLETE  Result Date: 07/11/2021    ECHOCARDIOGRAM REPORT   Patient Name:   ARNE SCHLENDER Date of Exam: 07/11/2021 Medical Rec #:  [id number]    Height:       70.0 in Accession #:    [id number]   Weight:       195.0 lb Date of Birth:  15-Feb-1941    BSA:          2.065 m Patient Age:    80 years      BP:           151/106 mmHg Patient Gender: M            HR:           98 bpm. Exam Location:  [facility] Procedure: 2D Echo, Cardiac Doppler and Color Doppler Indications:    TIA  History:        Patient has no prior history of Echocardiogram examinations.                 TIA, Arrythmias:Atrial Fibrillation; Risk Factors:Hypertension.  Sonographer:    Wenda Low Referring Phys: Prien  1. Left ventricular ejection fraction, by estimation, is 65 to 70%. The left ventricle has normal function. The left ventricle has no regional wall motion abnormalities. Left ventricular diastolic parameters are indeterminate.  2. Right ventricular systolic function is normal. The right ventricular size is normal. Tricuspid regurgitation signal is inadequate for assessing PA pressure.  3. Left atrial size was mildly dilated.  4. Right atrial size was mildly dilated.  5. The mitral valve is normal in structure. No evidence of mitral valve regurgitation. No evidence of mitral stenosis.  6. The aortic valve is tricuspid. Aortic valve regurgitation is not visualized. No aortic stenosis is present.  7. The inferior vena cava is normal in size with greater than 50% respiratory variability, suggesting right atrial pressure of 3 mmHg. FINDINGS  Left Ventricle: Left ventricular ejection fraction, by estimation, is 65 to 70%. The left ventricle has normal function. The left ventricle has no regional wall motion abnormalities. The left ventricular internal cavity size was normal in size. There is  no left ventricular hypertrophy. Left ventricular diastolic parameters are indeterminate. Right Ventricle: The right ventricular size is normal. No increase in right ventricular wall thickness. Right ventricular systolic function is normal. Tricuspid regurgitation signal is inadequate for assessing PA pressure. Left Atrium: Left atrial size was mildly dilated. Right Atrium: Right atrial size was mildly dilated.  Pericardium: There is no evidence of pericardial effusion. Mitral Valve: The mitral valve is normal in structure. No evidence of mitral valve regurgitation. No evidence of mitral valve stenosis. MV peak gradient, 3.1 mmHg. The mean mitral valve gradient is 1.0 mmHg. Tricuspid Valve: The tricuspid valve is normal in structure. Tricuspid valve regurgitation is not demonstrated. No evidence of tricuspid stenosis. Aortic Valve: The aortic valve is tricuspid. Aortic valve regurgitation is not visualized. No aortic stenosis is present. Aortic valve mean gradient measures 2.0 mmHg. Aortic valve peak gradient measures 4.5 mmHg. Aortic valve area, by VTI measures 2.57 cm. Pulmonic Valve: The pulmonic valve was not well visualized. Pulmonic valve regurgitation is not visualized. No evidence of pulmonic stenosis. Aorta: The aortic root is normal in size and structure. Venous: The inferior vena cava is normal in size with greater than 50% respiratory variability, suggesting right atrial pressure of 3 mmHg. IAS/Shunts: No atrial level shunt detected by color flow Doppler.  LEFT VENTRICLE PLAX 2D LVIDd:         4.42 cm  Diastology LVIDs:         2.87 cm  LV e' medial:    9.11 cm/s LV PW:         0.90 cm  LV E/e' medial:  9.0 LV IVS:        0.90 cm  LV e' lateral:   16.20 cm/s LVOT diam:     2.20 cm  LV E/e' lateral: 5.1 LV SV:         51 LV SV Index:   25 LVOT Area:     3.80 cm  RIGHT VENTRICLE RV Basal diam:  3.70 cm RV Mid diam:    3.17 cm RV S prime:     14.50 cm/s TAPSE (M-mode): 2.4 cm LEFT ATRIUM              Index       RIGHT ATRIUM           Index LA diam:        4.60 cm  2.23 cm/m  RA Area:     24.30 cm LA Vol (A2C):   100.0 ml 48.43 ml/m RA Volume:   70.40 ml  34.09 ml/m LA Vol (A4C):   72.4 ml  35.06 ml/m LA Biplane Vol: 86.0 ml  41.65 ml/m  AORTIC VALVE AV Area (Vmax):    2.59 cm AV Area (Vmean):   2.48 cm AV Area (VTI):     2.57 cm AV Vmax:           106.00 cm/s AV Vmean:          65.500 cm/s AV VTI:             0.200 m AV Peak Grad:      4.5 mmHg AV Mean Grad:      2.0 mmHg LVOT Vmax:         72.30 cm/s LVOT Vmean:        42.700 cm/s LVOT VTI:          0.135 m LVOT/AV VTI ratio: 0.68  AORTA Ao Root diam: 3.70 cm MITRAL VALVE MV Area (PHT):  2.69 cm    SHUNTS MV Area VTI:   3.89 cm    Systemic VTI:  0.14 m MV Peak grad:  3.1 mmHg    Systemic Diam: 2.20 cm MV Mean grad:  1.0 mmHg MV Vmax:       0.88 m/s MV Vmean:      34.1 cm/s MV Decel Time: 282 msec MV E velocity: 82.30 cm/s Carlyle Dolly MD Electronically signed by Carlyle Dolly MD Signature Date/Time: 07/11/2021/11:02:46 AM    Final       Subjective: Feels well, walking around the unit, requested discharge at 7am. No slurred speech. His wife states he's at his baseline.   Discharge Exam: Vitals:   07/11/21 0516 07/11/21 0951  BP: (!) 151/106 (!) 139/98  Pulse: 98 91  Resp: 19   Temp: 98.5 F (36.9 C)   SpO2:     General: Pt is alert, awake, not in acute distress Cardiovascular: RRR, S1/S2 +, no rubs, no gallops Respiratory: CTA bilaterally, no wheezing, no rhonchi Abdominal: Soft, NT, ND, bowel sounds + Extremities: No edema, no cyanosis Neuro: No focal deficits noted.  Labs: BNP (last 3 results) No results for input(s): BNP in the last 8760 hours. Basic Metabolic Panel: Recent Labs  Lab 07/09/21 0950 07/10/21 1337  NA 138 138  K 3.8 3.8  CL 103 102  CO2 27 28  GLUCOSE 108* 93  BUN 20 22  CREATININE 1.18 1.24  CALCIUM 9.0 9.0   Liver Function Tests: Recent Labs  Lab 07/09/21 0950  AST 22  ALT 20  ALKPHOS 59  BILITOT 1.1  PROT 7.4  ALBUMIN 4.1   Recent Labs  Lab 07/09/21 0950  LIPASE 31   No results for input(s): AMMONIA in the last 168 hours. CBC: Recent Labs  Lab 07/09/21 0950 07/10/21 1337  WBC 8.8 6.3  NEUTROABS 7.1 4.3  HGB 17.5* 17.4*  HCT 52.8* 53.0*  MCV 101.5* 101.9*  PLT 168 153   Cardiac Enzymes: No results for input(s): CKTOTAL, CKMB, CKMBINDEX, TROPONINI in the last 168  hours. BNP: Invalid input(s): POCBNP CBG: No results for input(s): GLUCAP in the last 168 hours. D-Dimer No results for input(s): DDIMER in the last 72 hours. Hgb A1c No results for input(s): HGBA1C in the last 72 hours. Lipid Profile Recent Labs    07/11/21 0842  CHOL 118  HDL 48  LDLCALC 62  TRIG 38  CHOLHDL 2.5   Thyroid function studies Recent Labs    07/09/21 0953  TSH 1.058   Anemia work up No results for input(s): VITAMINB12, FOLATE, FERRITIN, TIBC, IRON, RETICCTPCT in the last 72 hours. Urinalysis    Component Value Date/Time   COLORURINE YELLOW 07/09/2021 1133   APPEARANCEUR CLEAR 07/09/2021 1133   LABSPEC 1.017 07/09/2021 1133   PHURINE 6.0 07/09/2021 1133   GLUCOSEU NEGATIVE 07/09/2021 1133   HGBUR MODERATE (A) 07/09/2021 1133   BILIRUBINUR NEGATIVE 07/09/2021 1133   KETONESUR 5 (A) 07/09/2021 1133   PROTEINUR NEGATIVE 07/09/2021 1133   NITRITE NEGATIVE 07/09/2021 1133   LEUKOCYTESUR NEGATIVE 07/09/2021 1133    Microbiology Recent Results (from the past 240 hour(s))  Resp Panel by RT-PCR (Flu A&B, Covid) Nasopharyngeal Swab     Status: None   Collection Time: 07/10/21  1:27 PM   Specimen: Nasopharyngeal Swab; Nasopharyngeal(NP) swabs in vial transport medium  Result Value Ref Range Status   SARS Coronavirus 2 by RT PCR NEGATIVE NEGATIVE Final    Comment: (NOTE) SARS-CoV-2 target nucleic acids are NOT  DETECTED.  The SARS-CoV-2 RNA is generally detectable in upper respiratory specimens during the acute phase of infection. The lowest concentration of SARS-CoV-2 viral copies this assay can detect is 138 copies/mL. A negative result does not preclude SARS-Cov-2 infection and should not be used as the sole basis for treatment or other patient management decisions. A negative result may occur with  improper specimen collection/handling, submission of specimen other than nasopharyngeal swab, presence of viral mutation(s) within the areas targeted by  this assay, and inadequate number of viral copies(<138 copies/mL). A negative result must be combined with clinical observations, patient history, and epidemiological information. The expected result is Negative.  Fact Sheet for Patients:  EntrepreneurPulse.com.au  Fact Sheet for Healthcare Providers:  IncredibleEmployment.be  This test is no t yet approved or cleared by the [place] FDA and  has been authorized for detection and/or diagnosis of SARS-CoV-2 by FDA under an Emergency Use Authorization (EUA). This EUA will remain  in effect (meaning this test can be used) for the duration of the COVID-19 declaration under Section 564(b)(1) of the Act, 21 U.S.C.section 360bbb-3(b)(1), unless the authorization is terminated  or revoked sooner.       Influenza A by PCR NEGATIVE NEGATIVE Final   Influenza B by PCR NEGATIVE NEGATIVE Final    Comment: (NOTE) The Xpert Xpress SARS-CoV-2/FLU/RSV plus assay is intended as an aid in the diagnosis of influenza from Nasopharyngeal swab specimens and should not be used as a sole basis for treatment. Nasal washings and aspirates are unacceptable for Xpert Xpress SARS-CoV-2/FLU/RSV testing.  Fact Sheet for Patients: EntrepreneurPulse.com.au  Fact Sheet for Healthcare Providers: IncredibleEmployment.be  This test is not yet approved or cleared by the [place] FDA and has been authorized for detection and/or diagnosis of SARS-CoV-2 by FDA under an Emergency Use Authorization (EUA). This EUA will remain in effect (meaning this test can be used) for the duration of the COVID-19 declaration under Section 564(b)(1) of the Act, 21 U.S.C. section 360bbb-3(b)(1), unless the authorization is terminated or revoked.  Performed at [facility], [address] [place], [place] [address]     Time coordinating discharge: Approximately 40 minutes  Patrecia Pour, MD  [facility] 07/11/2021, 11:46 AM

## 2021-07-12 ENCOUNTER — Encounter: Payer: Self-pay | Admitting: Neurology

## 2021-07-12 ENCOUNTER — Ambulatory Visit (INDEPENDENT_AMBULATORY_CARE_PROVIDER_SITE_OTHER): Payer: BC Managed Care – PPO | Admitting: Neurology

## 2021-07-12 VITALS — BP 136/93 | HR 98 | Ht 69.0 in | Wt 188.0 lb

## 2021-07-12 DIAGNOSIS — F0390 Unspecified dementia without behavioral disturbance: Secondary | ICD-10-CM | POA: Diagnosis not present

## 2021-07-12 DIAGNOSIS — R269 Unspecified abnormalities of gait and mobility: Secondary | ICD-10-CM | POA: Diagnosis not present

## 2021-07-12 MED ORDER — DONEPEZIL HCL 10 MG PO TABS: 10.0000 mg | ORAL_TABLET | Freq: Every day | ORAL | 11 refills | Status: AC

## 2021-07-12 MED ORDER — DONEPEZIL HCL 10 MG PO TABS: 10.0000 mg | ORAL_TABLET | Freq: Every day | ORAL | 3 refills | Status: DC

## 2021-07-12 MED ORDER — MEMANTINE HCL 10 MG PO TABS: 10.0000 mg | ORAL_TABLET | Freq: Two times a day (BID) | ORAL | 11 refills | Status: AC

## 2021-07-12 NOTE — Patient Instructions (Signed)
Meds ordered this encounter  Medications   memantine (NAMENDA) 10 MG tablet-1st month after meal    Sig: Take 1 tablet (10 mg total) by mouth 2 (two) times daily.    Dispense:  60 tablet    Refill:  11   donepezil (ARICEPT) 10 MG tablet--add on 2nd month after meal.    Sig: Take 1 tablet (10 mg total) by mouth at bedtime.    Dispense:  30 tablet    Refill:  11

## 2021-07-12 NOTE — Progress Notes (Signed)
Chief Complaint  Patient presents with   Transient Ischemic Attack    New rm with spouse sandra,  Pt is well, about 2 am he lost his speech but not sure if it was a stroke or not.       ASSESSMENT AND PLAN  Lucas Taylor is a 80 y.o. 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  MRI of brain on July 09 2021. No evidence of acute intracranial abnormality.   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   Moderate chronic small vessel ischemic changes within the cerebral white matter.   Mild-to-moderate generalized cerebral atrophy. Comparatively mild cerebellar atrophy.   Right mastoid effusion.   CTA of neck and head. 1. No evidence of acute intracranial abnormality. 2. No large vessel occlusion or proximal hemodynamically significant stenosis in the head or neck. 3. Emphysema.   Labs in July 11 2021:  A1C 5.2, Lipid Panel  LDL 62, BMP creat 1.24. CBC Hg 17.4, normal TSH, alcohol <10,   HISTORICAL  Lucas Taylor is a 80 year old male, seen in request by his  primary care physician Dr. Moshe Cipro for evaluation of confusional episode, he is accompanied by his wife for 52 years at today's visit on July 12, 2021   I reviewed and summarized the referring note. PMhx. 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, still gives lectures 3 times a week, and still doing studio work few times each week, since 2020, he was noted to have mild worsening gait abnormality, he rely on golf cart more while he played golf with his friends, he was also noted to have gradual onset of memory loss, searching for work, required more help in keeping up with his teaching  He usually get up few times in the middle of the night using bathroom  On July 09, 2021, he got up early morning 2 AM, wife noticed that he seems to be confused, taking longer to get into the bed, when he got up second time 4 AM, he was noted to have more confusion, difficulty understanding instruction, getting his thoughts out, he went to prepare his coffee, worried about him, wife decided to follow him, he got his coffee ready, then trying to pour his coffee into half-and-half bottle, instead of coffee cup.  This has really alert his wife, urged him to [facility] emergency room, there was no focal signs noted  Personally reviewed MRI of the brain on July 09, 2021, no acute abnormality, mild generalized atrophy, moderate supratentorium small vessel disease, CT angiogram of  head and neck showed no significant large vessel disease  Laboratory evaluations showed normal A1c 5.2, normal TSH, LDL 62  He is a longtime smoker, still smokes half pack a day, also drinks small amount of hard liquor daily, he sleeps well most of the time, stays in good mood,  PHYSICAL EXAM:   Vitals:   07/12/21 0749  BP: (!) 136/93  Pulse: 98  Weight: 188 lb (85.3 kg)  Height: 5\' 9"  (1.753 m)   Not recorded      Body mass index is 27.76 kg/m.  PHYSICAL EXAMNIATION:  Gen: NAD, conversant, well nourised, well groomed                     Cardiovascular: Regular rate rhythm, no peripheral edema, warm, nontender. Eyes: Conjunctivae clear without exudates or hemorrhage Neck: Supple, no carotid bruits. Pulmonary: Clear to auscultation bilaterally   NEUROLOGICAL EXAM:  MENTAL STATUS: Speech: Hard of hearing, tends to joke to cover his difficulties Montreal Cognitive Assessment  07/12/2021  Visuospatial/ Executive (0/5) 1  Naming (0/3) 3  Attention: Read list of digits (0/2) 2  Attention: Read list of letters (0/1) 0  Attention: Serial 7 subtraction starting at 100 (0/3) 0  Language: Repeat phrase (0/2) 1  Language : Fluency (0/1) 0  Abstraction (0/2) 2  Delayed Recall (0/5) 0  Orientation (0/6) 4  Total 13    CRANIAL NERVES: CN II: Visual fields are full to confrontation. Pupils are round equal and briskly reactive to light. CN III, IV, VI: extraocular movement are normal. No ptosis. CN V: Facial sensation is intact to light touch CN VII: Face is symmetric with normal eye closure  CN VIII: Hard of hearing CN IX, X: Phonation is normal. CN XI: Head turning and shoulder shrug are intact  MOTOR: Muscle bulk and tone are normal. Muscle strength is normal.  No significant rigidity or bradykinesia  REFLEXES: Reflexes are 1 and symmetric at the biceps, triceps, knees, and ankles. Plantar responses are flexor.  SENSORY: Mildly length dependent decreased light touch, vibratory sensation to distal shin level  COORDINATION: There is no trunk or limb dysmetria noted.  GAIT/STANCE: He can get up from seated position arm crossed, mild leaning forward, unsteady cautious gait,  REVIEW OF SYSTEMS:  Full 14 system review of systems performed and notable only for as above All other review of systems were negative.   ALLERGIES: No Known Allergies  HOME MEDICATIONS: Current Outpatient  Medications  Medication Sig Dispense Refill   acetaminophen (TYLENOL) 500 MG tablet Take 1,000 mg by mouth every 6 (six) hours as needed for mild pain.     atorvastatin (LIPITOR) 40 MG tablet Take 1 tablet (40 mg total) by mouth daily. 30 tablet 1   losartan (COZAAR) 50 MG tablet Take 50 mg by mouth daily.     metoprolol succinate (TOPROL-XL) 50 MG 24 hr tablet Take 50 mg by mouth daily.     XARELTO 20 MG TABS tablet Take 20 mg by mouth daily.     No current facility-administered medications for this visit.    PAST MEDICAL HISTORY: Past Medical History:  Diagnosis Date   Cancer ([place])    Bladder   DVT (deep venous thrombosis) ([place])    Hypercholesterolemia    Hypertension     PAST SURGICAL HISTORY: Past Surgical History:  Procedure Laterality Date   VARICOSE VEIN SURGERY      FAMILY HISTORY: History reviewed. No pertinent family history.  SOCIAL HISTORY: Social History  Socioeconomic History   Marital status: Married    Spouse name: Not on file   Number of children: Not on file   Years of education: Not on file   Highest education level: Not on file  Occupational History   Not on file  Tobacco Use   Smoking status: Every Day    Packs/day: 0.50    Types: Cigarettes   Smokeless tobacco: Never  Substance and Sexual Activity   Alcohol use: Yes    Comment: 1 glass of tonic a week   Drug use: Never   Sexual activity: Not on file  Other Topics Concern   Not on file  Social History Narrative   Not on file   Social Determinants of Health   Financial Resource Strain: Not on file  Food Insecurity: Not on file  Transportation Needs: Not on file  Physical Activity: Not on file  Stress: Not on file  Social Connections: Not on file  Intimate Partner Violence: Not on file    Total time spent reviewing the chart, obtaining history, examined patient, ordering tests, documentation, consultations and family, care coordination was 32 minutes    Marcial Pacas, M.D.  Ph.D.  [facility] [address] [place], [place] [address] Ph: [phone] Fax: [phone]  CC:  Moshe Cipro, MD [place],  [place] [address]  Moshe Cipro, MD

## 2021-07-13 LAB — SEDIMENTATION RATE: Sed Rate: 4 mm/hr (ref 0–30)

## 2021-07-13 LAB — RPR: RPR Ser Ql: NONREACTIVE

## 2021-07-13 LAB — VITAMIN B12: Vitamin B-12: 152 pg/mL — ABNORMAL LOW (ref 232–1245)

## 2021-07-13 LAB — C-REACTIVE PROTEIN: CRP: <1 mg/L (ref 0–10)

## 2021-07-16 ENCOUNTER — Telehealth: Payer: Self-pay | Admitting: Neurology

## 2021-07-16 NOTE — Telephone Encounter (Signed)
PT referral faxed to [facility] in [place]. Phone: [phone].

## 2021-07-16 NOTE — Telephone Encounter (Addendum)
I spoke to the patient's wife on DPR. The patient resides in [place], [place]. His PCP is also in [place], [place]. We can fax over the lab results so he may set up B12 injections at his PCP office (near his home). His wife states he has an appt at that office on 07/17/21.  Results faxed and confirmed.  PCP: Dr. Moshe Cipro Phone: [phone] Fax: [phone]

## 2021-07-16 NOTE — Telephone Encounter (Signed)
Please call patient, laboratory evaluation showed  Significant decreased B12, 152 PG/mL, he needs B12 supplement, IM, 1000 mcg daily for 1 week, weekly for 1 month, then monthly, this can be done by primary care physician,  If he decided to proceed to B12 injection through our office, the day he coming for his first injection, needs to have repeat B12 level,  B12, Homocystine Methylmalonic acid level Folic acid

## 2021-07-18 NOTE — Telephone Encounter (Signed)
Wife called back in to question B12 orders.   She said somebody told him to take 1000 mg B12 daily.  I educated patient that the PCP will manage and treat the B12 as well as quit smoking as that may improve his memory as well.    Patient denied further questions, verbalized understanding and expressed appreciation for the phone call.

## 2021-08-16 ENCOUNTER — Encounter: Payer: Self-pay | Admitting: *Deleted

## 2021-08-21 ENCOUNTER — Telehealth: Payer: Self-pay | Admitting: Neurology

## 2021-08-21 NOTE — Telephone Encounter (Signed)
I spoke to the patient's wife on DPR. She needs the test results from [facility] sent to another one of his physicians. I provided her with the hospital's phone number and instructed her to contact the medical records department at that location for assistance.

## 2021-08-21 NOTE — Telephone Encounter (Signed)
Pt's wife Lucas Taylor called stating she needs her husbands records such as his MRI, CT SCAN, and EEG sent to his doctor in [place]. She states His doctor in [place] wants to run another one but the Lucas Taylor states she doesn't think the insurance will cover 2 of them. Lucas Taylor is requesting a call back

## 2021-10-24 ENCOUNTER — Telehealth: Payer: Self-pay | Admitting: Neurology

## 2021-10-24 NOTE — Telephone Encounter (Signed)
Called wife back. Wanting to know why [facility] lab test (syphilis) was checked. Advised is part of the neurological work up that Dr. Krista Blue typically orders. His was non-reactive. She verbalized understanding. She then went on to advise that her husband passed on 10/28/2021. They were together 92 yr. She was tearful. She states he had hemorrhagic brain bleed 09/22/21. Transferred to [place] via helicopter and was there for 19 days.   She also asking about [facility] bill that she has. I directed her to call billing department for them at [phone].   I will send sympathy card as well.

## 2021-10-24 NOTE — Telephone Encounter (Signed)
Pt's wife called stating that she is needing to speak to the RN regarding all of the tests the pt is getting. Please advise.

## 2021-10-30 DEATH — deceased

## 2021-12-30 DEATH — deceased

## 2022-01-08 ENCOUNTER — Ambulatory Visit: Payer: Medicare Other | Admitting: Neurology

## 2023-02-22 IMAGING — MR MR HEAD W/O CM
11 of 12 series · 40 of 48 positions shown · non-contrast
Comparison: Head CT 06/09/2021.

CLINICAL DATA: Mental status change, unknown cause. Additional
history provided: Transient confusion.

EXAM:
MRI HEAD WITHOUT CONTRAST
TECHNIQUE: Multiplanar, multiecho pulse sequences of the brain and surrounding
structures were obtained without intravenous contrast.

[Series 5: DWI · axial · 4.0mm · 0.88mm/px · z∈[-87,+52]mm · 4 of 36 slices shown (1 of 6)]
[im 1/36]
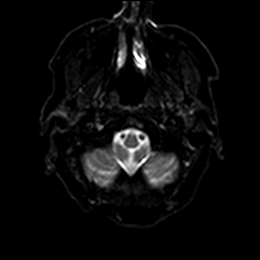
[im 12/36]
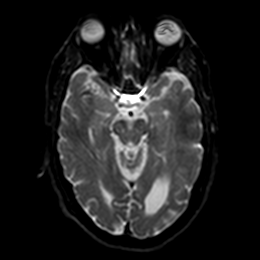
[im 24/36]
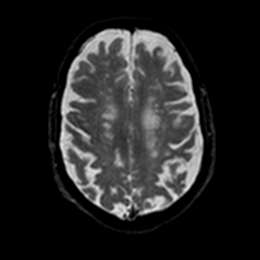
[im 36/36]
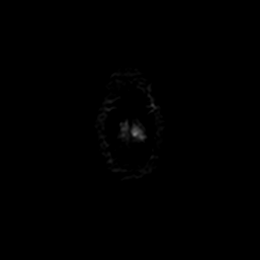

[Series 5: DWI · axial · 4.0mm · 0.88mm/px · z∈[-87,+52]mm · 4 of 36 slices shown (2 of 6)]
[im 1/36]
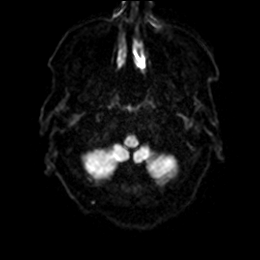
[im 12/36]
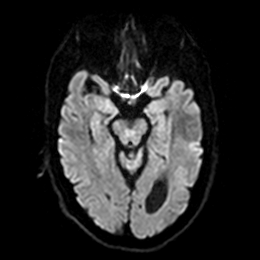
[im 24/36]
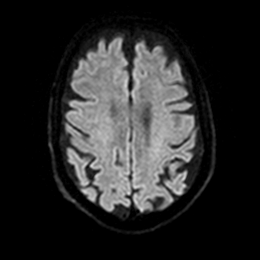
[im 36/36]
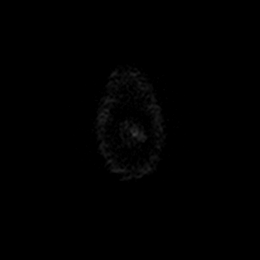

[Series 6: DWI · axial · 4.0mm · 0.88mm/px · z∈[-87,+52]mm · 4 of 36 slices shown (3 of 6)]
[im 1/36]
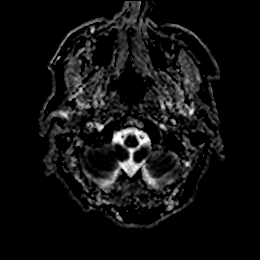
[im 12/36]
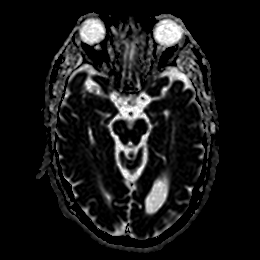
[im 24/36]
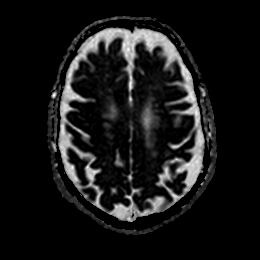
[im 36/36]
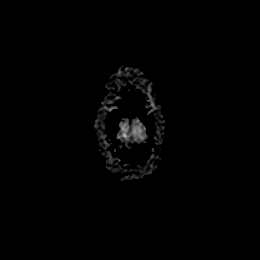

[Series 7: DWI · coronal · 5.0mm · 0.88mm/px · 3 of 28 slices shown (4 of 6)]
[im 1/28]
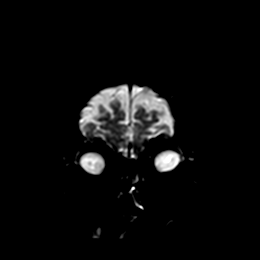
[im 14/28]
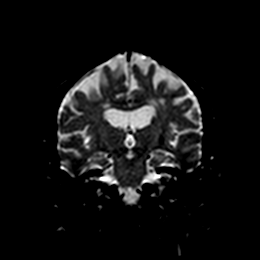
[im 28/28]
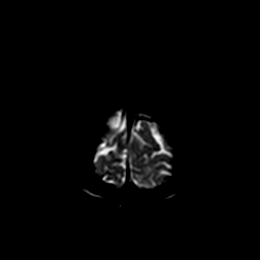

[Series 7: DWI · coronal · 5.0mm · 0.88mm/px · 4 of 28 slices shown (5 of 6)]
[im 1/28]
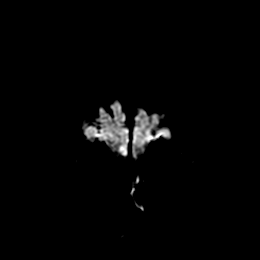
[im 10/28]
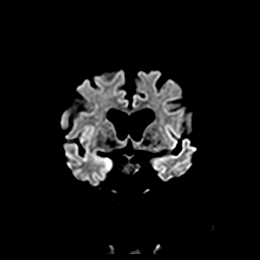
[im 19/28]
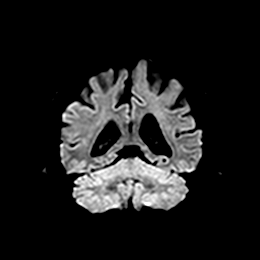
[im 28/28]
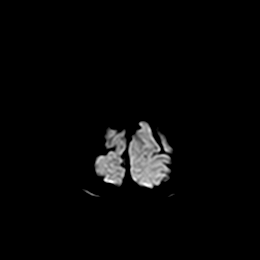

[Series 8: DWI · coronal · 5.0mm · 0.88mm/px · 4 of 28 slices shown (6 of 6)]
[im 1/28]
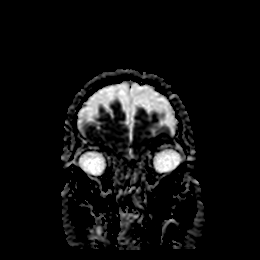
[im 10/28]
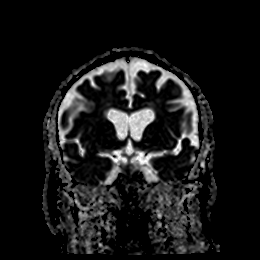
[im 19/28]
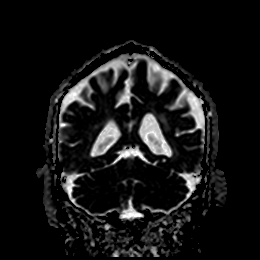
[im 28/28]
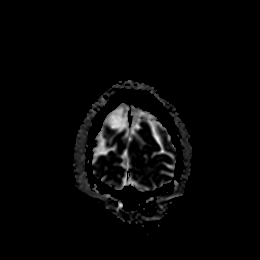

[Series 9: T1 · sagittal · 5.0mm · 0.94mm/px · 3 of 25 slices shown]
[im 1/25]
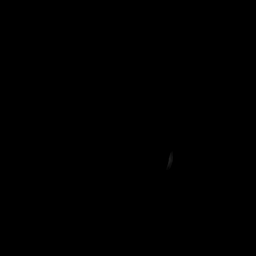
[im 13/25]
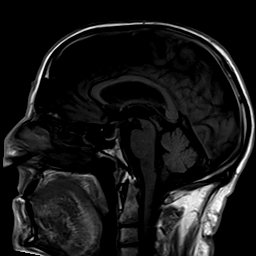
[im 25/25]
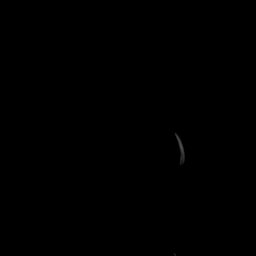

[Series 10: T2 · axial · 5.0mm · 0.72mm/px · z∈[-84,+49]mm · 3 of 20 slices shown (1 of 2)]
[im 1/20]
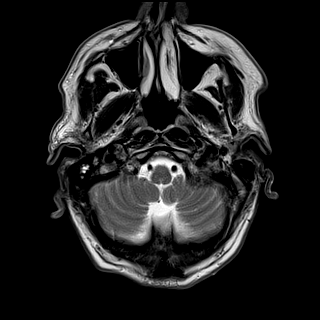
[im 10/20]
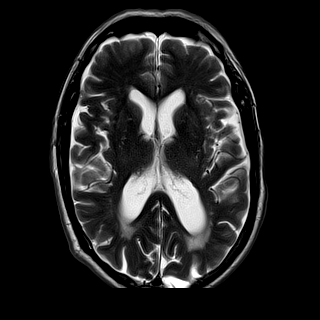
[im 20/20]
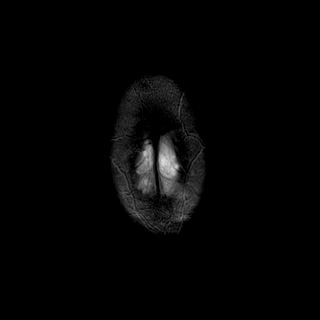

[Series 11: ax hemo · axial · 5.0mm · 0.86mm/px · z∈[-89,+54]mm · 3 of 25 slices shown]
[im 1/25]
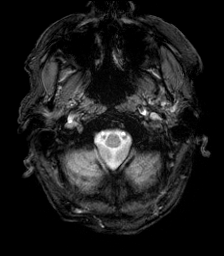
[im 13/25]
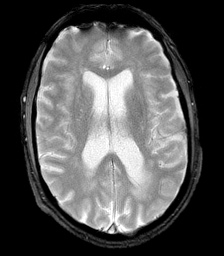
[im 25/25]
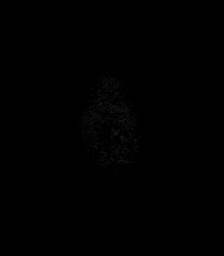

[Series 12: FLAIR · axial · 4.0mm · 0.43mm/px · z∈[-80,+44]mm · 4 of 32 slices shown]
[im 1/32]
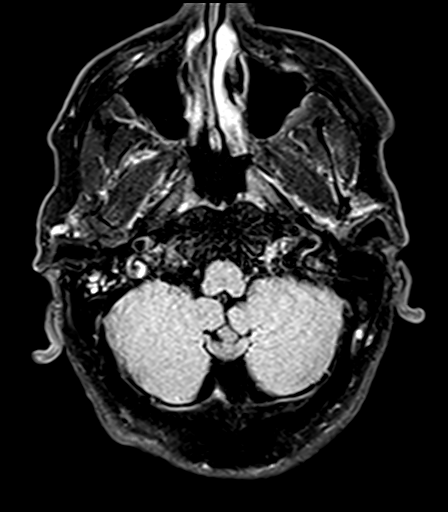
[im 11/32]
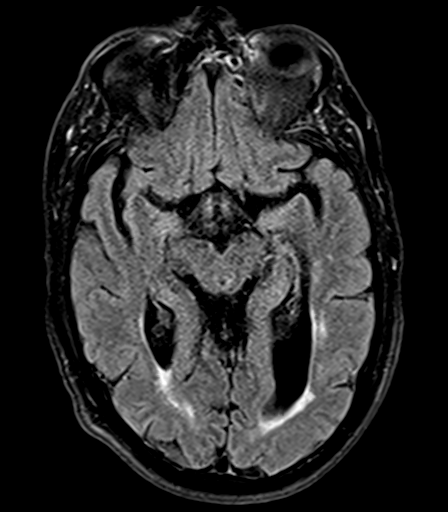
[im 21/32]
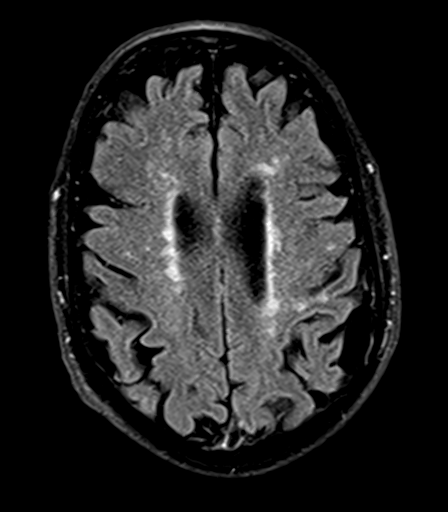
[im 32/32]
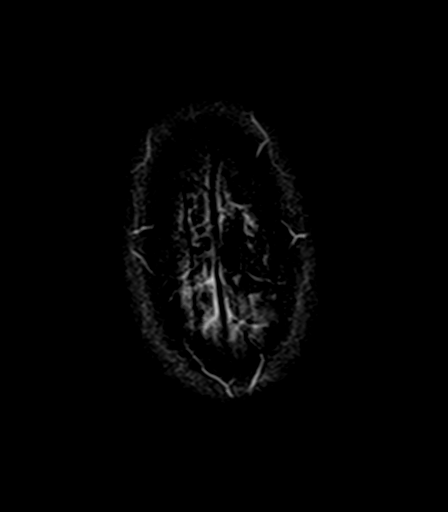

[Series 14: T2 · coronal · 5.0mm · 0.72mm/px · 4 of 28 slices shown (2 of 2)]
[im 1/28]
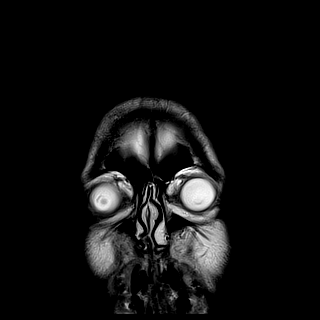
[im 10/28]
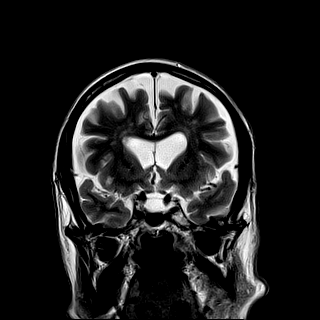
[im 19/28]
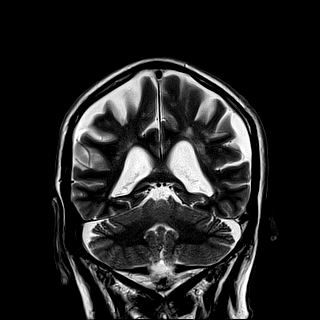
[im 28/28]
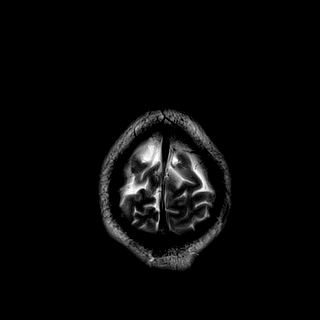

[40 of 48 positions shown; findings below may reference images not displayed]

FINDINGS: Brain:

Mild-to-moderate generalized cerebral atrophy.

A small chronic cortical infarct is questioned within the left
parietal lobe (postcentral gyrus (series 12, image 21).

Comparatively mild cerebellar atrophy. Moderate multifocal T2/FLAIR
hyperintensity within the cerebral white matter, nonspecific but
compatible with chronic small vessel ischemic disease.

There is no acute infarct.

No evidence of an intracranial mass.

No chronic intracranial blood products.

No extra-axial fluid collection.

No midline shift.

Partially empty sella turcica.

Vascular: Expected proximal arterial flow voids.

Skull and upper cervical spine: No focal marrow lesion.

Sinuses/Orbits: Visualized orbits show no acute finding. Bilateral
lens replacements. Trace bilateral ethmoid sinus mucosal thickening.

Other: Right mastoid effusion.
IMPRESSION: No evidence of acute intracranial abnormality.

A small chronic cortical infarct is questioned within the left
parietal lobe (postcentral gyrus).

Moderate chronic small vessel ischemic changes within the cerebral
white matter.

Mild-to-moderate generalized cerebral atrophy. Comparatively mild
cerebellar atrophy.

Right mastoid effusion.
